# Patient Record
Sex: Female | Born: 1955 | Hispanic: Yes | Marital: Married | Smoking: Never smoker
Health system: Southern US, Community
[De-identification: ages and names within clinical notes are randomized; demographics above are authoritative.]

## PROBLEM LIST (undated history)

## (undated) DIAGNOSIS — E119 Type 2 diabetes mellitus without complications: Secondary | ICD-10-CM

## (undated) DIAGNOSIS — E7800 Pure hypercholesterolemia, unspecified: Secondary | ICD-10-CM

## (undated) HISTORY — DX: Pure hypercholesterolemia, unspecified: E78.00

## (undated) HISTORY — DX: Type 2 diabetes mellitus without complications: E11.9

---

## 2005-10-29 ENCOUNTER — Ambulatory Visit: Payer: Self-pay

## 2007-03-24 ENCOUNTER — Ambulatory Visit: Payer: Self-pay

## 2008-03-23 ENCOUNTER — Ambulatory Visit: Payer: Self-pay

## 2017-03-12 ENCOUNTER — Ambulatory Visit: Payer: Self-pay | Attending: Oncology | Admitting: *Deleted

## 2017-03-12 ENCOUNTER — Encounter: Payer: Self-pay | Admitting: *Deleted

## 2017-03-12 ENCOUNTER — Ambulatory Visit
Admission: RE | Admit: 2017-03-12 | Discharge: 2017-03-12 | Disposition: A | Discharge status: Home / Self Care | Payer: Self-pay | Source: Ambulatory Visit | Attending: Oncology | Admitting: Oncology

## 2017-03-12 VITALS — BP 122/76 | HR 75 | Temp 97.5°F | Resp 18 | Ht <= 58 in | Wt 144.0 lb

## 2017-03-12 DIAGNOSIS — Z0000 Encounter for general adult medical examination without abnormal findings: Secondary | ICD-10-CM

## 2017-03-12 NOTE — Patient Instructions (Signed)
Prueba del VPH (HPV Test) La prueba del virus del papiloma humano (VPH) se usa para detectar los tipos de infeccin por el VPH de alto riesgo. El VPH es un grupo de alrededor de 100 virus. Muchos de estos virus causan tumores dentro de los genitales, sobre ellos o a su alrededor. La mayora de los VPH provocan infecciones que suelen desaparecen sin tratamiento. Sin embargo, los tipos 6, 11, 16 y 18 del VPH se consideran de alto riesgo y pueden aumentar el riesgo de padecer cncer de cuello del tero o de ano si la infeccin no se trata. La prueba del VPH identifica las cadenas de ADN (genticas) de la infeccin por el VPH, por lo que tambin se denomina prueba de ADN para el VPH. Aunque el VPH se encuentra tanto en los hombres como en las mujeres, la prueba del VPH se usa solo para detectar un mayor riesgo de cncer en las mujeres:  Con una prueba de Papanicolaou anormal.  Despus del tratamiento de una prueba de Papanicolaou anormal.  Entre 30y 65aos.  Despus del tratamiento de una infeccin por el VPH de alto riesgo. La prueba del VPH se puede hacer al mismo tiempo que un examen plvico y una prueba de Papanicolaou en mujeres de ms de 30 aos. Tanto la prueba del VPH como la prueba de Papanicolaou requieren una muestra de clulas del cuello del tero. PREPARACIN PARA EL ESTUDIO  No se haga lavados vaginales ni se d un bao durante 24 a 48 horas antes de la prueba o como se lo haya indicado el mdico.  No tenga sexo durante 24 a 48 horas antes de la prueba o como se lo haya indicado el mdico.  Es posible que se le pida que reprograme la prueba si est menstruando.  Se le pedir que orine antes de la prueba. RESULTADOS Es su responsabilidad retirar el resultado del estudio. Consulte en el laboratorio o en el departamento en el que fue realizado el estudio cundo y cmo podr obtener los resultados. Hable con el mdico si tiene alguna pregunta sobre los resultados. El resultado ser  negativo o positivo. Significado de los resultados negativos de la prueba Un resultado negativo de la prueba del VPH significa que no se detect el VPH, y es muy probable que no tenga el virus. Significado de los resultados positivos de la prueba Un resultado positivo de la prueba del VPH indica que tiene el virus.  Si el resultado de la prueba muestra la presencia de alguna cadena del VPH de alto riesgo, puede tener mayor riesgo de padecer cncer de cuello del tero o de ano si la infeccin no se trata.  Si se encuentran cadenas del VPH de bajo riesgo, es poco probable que tenga un alto riesgo de padecer cncer. Hable con el mdico sobre los resultados. El mdico utilizar los resultados para realizar un diagnstico y determinar un plan de tratamiento adecuado para usted. Esta informacin no tiene como fin reemplazar el consejo del mdico. Asegrese de hacerle al mdico cualquier pregunta que tenga. Document Released: 12/19/2008 Document Revised: 09/23/2014 Document Reviewed: 01/18/2014 Elsevier Interactive Patient Education  2017 Elsevier Inc.  Gave patient hand-out, Women Staying Healthy, Active and Well from [facility], with education on breast health, pap smears, heart and colon health.  

## 2017-03-12 NOTE — Progress Notes (Signed)
Subjective:     Patient ID: Billey GoslingMaria R Patino Alvarez, female   DOB: 11-11-1955, 61 y.o.   MRN: [id number]  HPI   Review of Systems     Objective:   Physical Exam  Pulmonary/Chest: Right breast exhibits no inverted nipple, no mass, no nipple discharge, no skin change and no tenderness. Left breast exhibits no inverted nipple, no mass, no nipple discharge, no skin change and no tenderness. Breasts are symmetrical.  Abdominal: There is no splenomegaly or hepatomegaly.  Genitourinary: No labial fusion. There is no rash, tenderness, lesion or injury on the right labia. There is no rash, tenderness, lesion or injury on the left labia. Cervix exhibits no motion tenderness, no discharge and no friability. Right adnexum displays no mass, no tenderness and no fullness. Left adnexum displays no mass, no tenderness and no fullness. No erythema, tenderness or bleeding in the vagina. No foreign body in the vagina. No signs of injury around the vagina. No vaginal discharge found.       Assessment:     61 year old [place] [facility].  [place] pap [place].  Since I have no records for review will go ahead with her pap smear today.  [name], the interpreter present during the interview and exam.  Clinical breast exam unremarkable.  Taught self breast exam.  Specimen collected for pap smear.  Patient has been screened for eligibility.  She does not have any insurance, Medicare or Medicaid.  She also meets financial eligibility.  Hand-out given on the Affordable Care Act.    Plan:     Screening mammogram ordered.  Specimen for pap smear sent to the lab.  Will follow-up per [facility] protocol.

## 2017-03-14 LAB — PAP LB AND HPV HIGH-RISK
HPV, high-risk: POSITIVE — AB
PAP SMEAR COMMENT: 0

## 2017-03-31 ENCOUNTER — Inpatient Hospital Stay
Admission: RE | Admit: 2017-03-31 | Discharge: 2017-03-31 | Disposition: A | Discharge status: Home / Self Care | Payer: Self-pay | Source: Ambulatory Visit | Attending: *Deleted | Admitting: *Deleted

## 2017-03-31 ENCOUNTER — Other Ambulatory Visit: Payer: Self-pay | Admitting: *Deleted

## 2017-03-31 DIAGNOSIS — Z9289 Personal history of other medical treatment: Secondary | ICD-10-CM

## 2017-04-11 ENCOUNTER — Encounter: Payer: Self-pay | Admitting: *Deleted

## 2017-04-11 NOTE — Progress Notes (Signed)
Letter mailed to inform patient of her normal mammogram and pap smear showing positive for HPV.  She has been scheduled to return for annual mammo and pap on 04/08/18 @ 9:30.  [place]

## 2018-04-08 ENCOUNTER — Ambulatory Visit: Payer: Self-pay | Attending: Oncology | Admitting: *Deleted

## 2018-04-08 ENCOUNTER — Ambulatory Visit
Admission: RE | Admit: 2018-04-08 | Discharge: 2018-04-08 | Disposition: A | Discharge status: Home / Self Care | Payer: Self-pay | Source: Ambulatory Visit | Attending: Oncology | Admitting: Oncology

## 2018-04-08 ENCOUNTER — Ambulatory Visit: Payer: Self-pay

## 2018-04-08 ENCOUNTER — Other Ambulatory Visit: Payer: Self-pay

## 2018-04-08 VITALS — BP 121/74 | HR 80 | Temp 97.0°F | Ht <= 58 in | Wt 127.0 lb

## 2018-04-08 DIAGNOSIS — Z0000 Encounter for general adult medical examination without abnormal findings: Secondary | ICD-10-CM

## 2018-04-08 NOTE — Progress Notes (Addendum)
  Subjective:     Patient ID: Billey GoslingMaria R Patino Alvarez, female   DOB: 1955/11/24, 62 y.o.   MRN: [id number]  HPI   Review of Systems     Objective:   Physical Exam  Pulmonary/Chest: Right breast exhibits no inverted nipple, no mass, no nipple discharge, no skin change and no tenderness. Left breast exhibits no inverted nipple, no mass, no nipple discharge, no skin change and no tenderness.    Abdominal: There is no splenomegaly or hepatomegaly.  Genitourinary: Pelvic exam was performed with patient supine. No labial fusion. There is no rash, tenderness, lesion or injury on the right labia. There is no rash, tenderness, lesion or injury on the left labia. Cervix exhibits no motion tenderness, no discharge and no friability. Right adnexum displays no mass, no tenderness and no fullness. Left adnexum displays no mass, no tenderness and no fullness. No erythema, tenderness or bleeding in the vagina. No foreign body in the vagina. No signs of injury around the vagina. No vaginal discharge found.       Assessment:     62 year old [place] [facility]  [name], the interpreter present during the interview and exam.  Clinical breast exam with grainy fibroglandular tissue. Taught self breast awareness.  Last pap on 03/12/17 was negative / HPV positive.  Specimen collected for pap smear without difficulty. Patient has been screened for eligibility.  She does not have any insurance, Medicare or Medicaid.  She also meets financial eligibility.  Hand-out given on the Affordable Care Act.  Risk Assessment    Risk Scores      04/08/2018   Last edited by: Scarlett PrestoShaver, Anne F, RN   5-year risk: 1.2 %   Lifetime risk: 6.4 %            Plan:     Screening mammogram ordered.  Specimen for pap sent to the lab.

## 2018-04-08 NOTE — Patient Instructions (Signed)
Gave patient hand-out, Women Staying Healthy, Active and Well from [facility], with education on breast health, pap smears, heart and colon health.Prueba del VPH HPV Test La prueba del virus del papiloma humano (VPH) se usa para detectar los tipos de infeccin por el VPH de alto riesgo. El VPH es un grupo de alrededor de 100 virus. Muchos de estos virus causan tumores dentro de los genitales, sobre ellos o a su alrededor. La mayora de los VPH provocan infecciones que suelen desaparecen sin tratamiento. Sin embargo, los tipos 6, 11, 16 y 18 del VPH se consideran de alto riesgo y pueden aumentar el riesgo de padecer cncer de cuello del tero o de ano si la infeccin no se trata. La prueba del VPH identifica las cadenas de ADN (genticas) de la infeccin por el VPH, por lo que tambin se denomina prueba de ADN para el VPH. Aunque el VPH se encuentra tanto en los hombres como en las mujeres, la prueba del VPH se usa solo para detectar un mayor riesgo de cncer en las mujeres:  Con una prueba de Papanicolaou anormal.  Despus del tratamiento de una prueba de Papanicolaou anormal.  Entre 30y 65aos.  Despus del tratamiento de una infeccin por el VPH de alto riesgo.  La prueba del VPH se puede hacer al mismo tiempo que un examen plvico y una prueba de Papanicolaou en mujeres de ms de 30 aos. Tanto la prueba del VPH como la prueba de Papanicolaou requieren una muestra de clulas del cuello del tero. Cmo debo prepararme para esta prueba?  No se haga lavados vaginales ni se d un bao durante 24 a 48 horas antes de la prueba o como se lo haya indicado el mdico.  No tenga sexo durante 24 a 48 horas antes de la prueba o como se lo haya indicado el mdico.  Es posible que se le pida que reprograme la prueba si est menstruando.  Se le pedir que orine antes de la prueba. Qu significan los resultados? Es su responsabilidad retirar el resultado del estudio. Consulte en el laboratorio o en el  departamento en el que fue realizado el estudio cundo y cmo podr obtener los resultados. Hable con el mdico si tiene alguna pregunta sobre los resultados. El resultado ser negativo o positivo. Significado de los resultados negativos del anlisis Un resultado negativo de la prueba del VPH significa que no se detect el VPH, y es muy probable que no tenga el virus. Significado de los resultados positivos del anlisis Un resultado positivo de la prueba del VPH indica que tiene el virus.  Si el resultado de la prueba muestra la presencia de alguna cadena del VPH de alto riesgo, puede tener mayor riesgo de padecer cncer de cuello del tero o de ano si la infeccin no se trata.  Si se encuentran cadenas del VPH de bajo riesgo, es poco probable que tenga un alto riesgo de padecer cncer.  Hable con el mdico sobre los resultados. El mdico utilizar los resultados para realizar un diagnstico y determinar un plan de tratamiento adecuado para usted. Hable con el mdico sobre los resultados, las opciones de tratamiento y, si es necesario, la necesidad de realizar ms estudios. Hable con el mdico si tiene alguna pregunta sobre los resultados. Esta informacin no tiene como fin reemplazar el consejo del mdico. Asegrese de hacerle al mdico cualquier pregunta que tenga. Document Released: 12/19/2008 Document Revised: 11/21/2016 Document Reviewed: 01/18/2014 Elsevier Interactive Patient Education  2018 Elsevier Inc.  

## 2018-04-10 LAB — PAP LB AND HPV HIGH-RISK
HPV, HIGH-RISK: POSITIVE — AB
PAP Smear Comment: 0

## 2018-04-16 ENCOUNTER — Encounter: Payer: Self-pay | Admitting: *Deleted

## 2018-04-16 NOTE — Progress Notes (Signed)
Called patient with her mammogram and pap smear results via Anne NgMartiza, the interpreter.  Patient has normal mammogram.  Patient results are negative HPV positive.  Since this is her second HPV positive pap smear she will need referral for colposcopy and possible biopsy.  [place] in [place] and will not be available until after September 20th.  I have scheduled her to see Dr. Valentino Saxonherry on 06/09/18 @ 10:00.

## 2018-04-20 ENCOUNTER — Encounter: Payer: Self-pay | Admitting: *Deleted

## 2018-06-09 ENCOUNTER — Encounter: Payer: Self-pay | Admitting: Obstetrics and Gynecology

## 2018-06-12 ENCOUNTER — Encounter: Payer: Self-pay | Admitting: Obstetrics and Gynecology

## 2018-06-12 ENCOUNTER — Other Ambulatory Visit: Payer: Self-pay | Admitting: Obstetrics and Gynecology

## 2018-06-12 ENCOUNTER — Ambulatory Visit (INDEPENDENT_AMBULATORY_CARE_PROVIDER_SITE_OTHER): Payer: Self-pay | Admitting: Obstetrics and Gynecology

## 2018-06-12 VITALS — BP 127/70 | HR 69 | Ht <= 58 in | Wt 121.3 lb

## 2018-06-12 DIAGNOSIS — B977 Papillomavirus as the cause of diseases classified elsewhere: Secondary | ICD-10-CM

## 2018-06-12 NOTE — Progress Notes (Signed)
    GYNECOLOGY CLINIC COLPOSCOPY PROCEDURE NOTE  62 y.o. [id number] Hispanic female here for colposcopy for NILM, HR HPV+ (no typing performed) pap smear on 04/08/2018. 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  Interpreter present for today's visit.   Patient given informed consent, signed copy in the chart, time out was performed.  Placed in lithotomy position. Cervix viewed with speculum and colposcope after application of acetic acid.   Colposcopy adequate? Yes  HPV changes noted at 9 o'clock; corresponding biopsies obtained.  ECC specimen obtained. All specimens were labeled and sent to pathology.  Also HPV typing performed today.    Patient was given post procedure instructions.  Will follow up pathology and manage accordingly; patient will be contacted with results and recommendations.  Routine preventative health maintenance measures emphasized.    Hildred Laser, MD [facility] 06/12/2018 11:27 AM

## 2018-06-12 NOTE — Patient Instructions (Signed)
Prueba del VPH HPV Test La prueba del virus del papiloma humano (VPH) se [place] para Engineer, manufacturing los tipos de infeccin por el VPH de Conservator, museum/gallery. El VPH es un grupo de alrededor de 100 virus. Muchos de estos virus causan tumores dentro de los genitales, sobre ellos o a su alrededor. La mayora de los VPH provocan infecciones que suelen desaparecen sin tratamiento. Sin embargo, los tipos 6, 11, 16 y 31 del VPH se consideran de Conservator, museum/gallery y pueden aumentar el riesgo de Geneticist, molecular de cuello del tero o de ano si la infeccin no se trata. La prueba del VPH identifica las cadenas de ADN (genticas) de la infeccin por el VPH, por lo que tambin se denomina prueba de [place] para [employer]. Aunque el VPH se Building surveyor en los hombres como en las mujeres, la prueba del VPH se [place] solo para Engineer, manufacturing un mayor riesgo de cncer en las mujeres:  Con una prueba de Papanicolaou anormal.  Despus del tratamiento de una prueba de Papanicolaou anormal.  Entre 30y 65aos.  Despus del tratamiento de una infeccin por el VPH de [employer].  La prueba del VPH se puede hacer al mismo tiempo que un examen plvico y Neomia Dear prueba de Papanicolaou en mujeres de ms de 30 aos. Tanto la prueba del VPH como la prueba de Papanicolaou requieren Lauris Poag de clulas del cuello del tero. Cmo debo prepararme para esta prueba?  No se haga lavados vaginales ni se d un bao durante 24 a 48 horas antes de la prueba o como se lo haya indicado el mdico.  No tenga sexo durante 24 a 48 horas antes de la prueba o como se lo haya indicado el mdico.  Es posible que se le pida que reprograme la prueba si est menstruando.  Se le pedir que orine antes de la prueba. Qu significan los resultados? Es su responsabilidad retirar el resultado del [place]. Consulte en el laboratorio o en el departamento en el que fue realizado el estudio cundo y cmo podr [employer]. Hable con el mdico si tiene [employer]. El resultado ser negativo o positivo. Significado de los [employer] Corporation del anlisis Un resultado negativo de la prueba del VPH significa que no se detect el VPH, y es muy probable que no tenga el virus. Significado de [employer] positivos del anlisis Un resultado positivo de la prueba del VPH indica que tiene el virus.  Si el resultado de la prueba [place] la presencia de [place] cadena del VPH de alto riesgo, puede tener mayor riesgo de Geneticist, molecular de cuello del tero o de ano si la infeccin no se trata.  Si se encuentran cadenas del VPH de bajo riesgo, es poco probable que tenga un alto riesgo de Geneticist, molecular.  Hable con el mdico [employer]. El mdico [employer] para Education officer, environmental un diagnstico y Chief Strategy Officer un plan de tratamiento adecuado para usted. Hable con el mdico [employer], las opciones de tratamiento y, si es necesario, la necesidad de Education officer, environmental ms [place]. Hable con el mdico si tiene [employer]. Esta informacin no tiene Theme park manager el consejo del mdico. Asegrese de hacerle al mdico cualquier pregunta que tenga. Document Released: 12/19/2008 Document Revised: 11/21/2016 Document Reviewed: 01/18/2014 Elsevier Interactive Patient Education  2018 [employer].   Virus del papiloma humano (Human Papillomavirus) El virus del papiloma humano (VPH) es la enfermedad de transmisin sexual (ETS) ms frecuente. Se  transmite fcilmente de [place] persona a otra (es contagiosa). El VPH puede provocar cncer de cuello del tero, cncer anal y verrugas genitales. Las verrugas genitales pueden observarse y sentirse. Adems, es posible desarrollar lesiones similares a las Microbiologist. Es posible que el VPH no presente ningn sntoma. Puede padecer el VPH por un perodo prolongado y no saberlo. Puede transmitirlo a otros sin saberlo. CUIDADOS EN EL HOGAR  Tome los Estée Lauder indic  el mdico.  Use cremas de venta libre para la picazn como se lo haya indicado el mdico.  Cumpla con todas las visitas de control. Asegrese de realizarse las pruebas de Papanicolaou como se lo haya indicado el mdico.  No toque ni rasque las verrugas.  No trate las verrugas genitales con los medicamentos utilizados para el tratamiento de las verrugas de las manos.  No puede Nutritional therapist.  No utilice tampones ni duchas vaginales durante el tratamiento del VPH.  Informe a su compaero sexual acerca de su infeccin porque tambin podra Network engineer.  Si queda embarazada, informe a su mdico que [employer]. El mdico controlar rigurosamente Firefighter. Esto es importante para la seguridad del beb.  Despus del tratamiento, use preservativos durante las relaciones sexuales para prevenir futuras infecciones.  Tenga solo un compaero sexual.  No tenga un compaero sexual que tenga otros International aid/development worker.  SOLICITE AYUDA SI:  La piel tratada se enrojece, se hincha o duele.  Tiene fiebre.  Se siente enferma.  Siente bultos o reas con protuberancias tipo granos en la zona genital o alrededor de esta.  Presenta sangrado vaginal o en la zona que recibi tratamiento.  Siente dolor durante las [employer].  ASEGRESE DE QUE:  Comprende estas instrucciones.  Controlar su afeccin.  Solicitar ayuda de inmediato si no mejora o si empeora.  Esta informacin no tiene Theme park manager el consejo del mdico. Asegrese de hacerle al mdico cualquier pregunta que tenga. Document Released: 10/05/2010 Document Revised: 09/23/2014 Document Reviewed: 12/08/2013 Elsevier Interactive Patient Education  2017 [employer].     [place], Cuidado posterior (Colposcopy, Care After) La colposcopa es un procedimiento en el que se utiliza una herramienta especial para magnificar la superficie del cuello del tero. Tambin es  posible que se tome una muestra de tejido (biopsia). Esta muestra se observar para identificar la presencia de cncer cervical u otros problemas. Despus del procedimiento:  Podr sentir algunos clicos.  Recustese algunos minutos si se siente mareada.  Podr tener un sangrado que debera detenerse luego de [place]. CUIDADOS EN EL HOGAR  No tenga relaciones sexuales ni use tampones durante 2 o 3 das o segn le hayan indicado.  Slo tome medicamentos como lo indique su mdico.  Contine tomando las pastillas anticonceptivas de la forma habitual. Averige los Victor de su anlisis Pregunte cundo estarn [employer] del examen. Asegrese de Starbucks Corporation. SOLICITE AYUDA DE INMEDIATO SI:  Tiene un sangrado abundante o elimina cogulos.  Su temperatura es de 102 F (38.9 C) o mayor.  Brett Fairy secrecin vaginal anormal.  Tiene clicos que no se [employer].  Siente mareos, vrtigo o pierde el conocimiento (se desmaya). ASEGRESE DE QUE:   Comprende estas instrucciones.  Controlar su enfermedad.  Solicitar ayuda de inmediato si no mejora o si empeora. Esta informacin no tiene Theme park manager el consejo del mdico. Asegrese de hacerle al mdico cualquier pregunta que tenga. Document Released: 10/05/2010 Document Revised: 11/25/2011 Elsevier  Education  2017 Elsevier Inc.  

## 2018-06-12 NOTE — Progress Notes (Signed)
   PT is present today for a colpo. Pt stated that she is doing well no complaints.   

## 2018-06-18 LAB — PATHOLOGY

## 2018-06-19 LAB — HPV GENOTYPES 16/18,45
HPV GENOTYPE 16: NEGATIVE
HPV GENOTYPE 18,45: NEGATIVE

## 2018-06-23 ENCOUNTER — Encounter: Payer: Self-pay | Admitting: *Deleted

## 2018-06-23 NOTE — Progress Notes (Signed)
Talked to patient today via Orson Slick, the interpreter.  Informed of her cervical biopsy results and recommendation from Dr. Valentino Saxon of possible LEEP procedure.  The patient is not a [place] Citizen, therefore, I cannot apply for [facility] Medicaid to cover the LEEP.  The patient has agreed to complete patient financial paperwork for [facility].  She requested I mail the application to her.  Application sent to patient and she is to return to me at the cancer center.  Dr. Oretha Milch office to schedule follow-up for the patient.

## 2018-07-16 ENCOUNTER — Encounter: Payer: Self-pay | Admitting: *Deleted

## 2018-07-16 NOTE — Progress Notes (Signed)
Patient turned in her patient assistant financial application.  It has been turned in to [facility] for processing.

## 2018-08-18 ENCOUNTER — Encounter: Payer: Self-pay | Admitting: *Deleted

## 2018-08-18 NOTE — Progress Notes (Addendum)
Spoke to Reliant EnergyBrandi Harvell today in regards to patient's financial assistance approval.  She has emailed [facility] to check the status.  Patient is pending a LEEP procedure.  Patient has been approved for Financial Assistance [facility].  Approval effective 12/1/9 - 02/15/2019 - Message sent to Dr. Valentino Saxonherry.

## 2018-09-04 ENCOUNTER — Encounter: Payer: Self-pay | Admitting: Obstetrics and Gynecology

## 2018-09-21 ENCOUNTER — Encounter: Payer: Self-pay | Admitting: *Deleted

## 2018-09-21 NOTE — Progress Notes (Signed)
Patient is scheduled for her LEEP procedure with Dr. Valentino Saxon on 09/29/18.  Will follow with next pap per Dr. Valentino Saxon and [facility] guidelines.

## 2018-09-29 ENCOUNTER — Ambulatory Visit (INDEPENDENT_AMBULATORY_CARE_PROVIDER_SITE_OTHER): Payer: Self-pay | Admitting: Obstetrics and Gynecology

## 2018-09-29 ENCOUNTER — Other Ambulatory Visit (HOSPITAL_COMMUNITY)
Admission: RE | Admit: 2018-09-29 | Discharge: 2018-09-29 | Disposition: A | Discharge status: Home / Self Care | Payer: Self-pay | Source: Ambulatory Visit | Attending: Obstetrics and Gynecology | Admitting: Obstetrics and Gynecology

## 2018-09-29 ENCOUNTER — Encounter: Payer: Self-pay | Admitting: Obstetrics and Gynecology

## 2018-09-29 VITALS — BP 124/76 | HR 71 | Ht <= 58 in | Wt 126.6 lb

## 2018-09-29 DIAGNOSIS — N871 Moderate cervical dysplasia: Secondary | ICD-10-CM | POA: Insufficient documentation

## 2018-09-29 MED ORDER — IBUPROFEN 600 MG PO TABS: 600.0000 mg | ORAL_TABLET | Freq: Four times a day (QID) | ORAL | 1 refills | Status: AC | PRN

## 2018-09-29 NOTE — Addendum Note (Signed)
Addended by: Silvano Bilis on: 09/29/2018 11:34 AM   Modules accepted: Orders

## 2018-09-29 NOTE — Progress Notes (Signed)
    GYNECOLOGY OFFICE PROCEDURE NOTE  Yvette Taylor is a 63 y.o. [phone] here for LEEP. No GYN concerns. Pap smear and colposcopy history reviewed.    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  Role of HPV,cervical dysplasia and need for close followup was empasized. Informed written consent was obtained. All questions were answered. Time out performed. Urine pregnancy test was negative.  Procedure: The patient was placed in lithotomy position and the bivalved coated speculum was placed in the patient's vagina. A grounding pad placed on the patient. Lugol's solution was applied to the cervix and areas of decreased uptake were noted around the transformation zone.   Local anesthesia was administered via an intracervical block using 10 ml of 1% Lidocaine with epinephrine. The suction was turned on and the Medium 1X Fisher [facility] Biopsy Excisor on 37 Watts of blended current was used to excise the area of decreased uptake and excise the entire transformation zone.  A A small LEEP hat was then used to excise an area of the endocervix. Excellent hemostasis was achieved using roller ball coagulation set at 60 Watts coagulation current. Monsel's solution was then applied and the speculum was removed from the vagina. Specimens were sent to pathology.  The patient tolerated the procedure well. Post-operative instructions given to patient, including instruction to seek medical attention for persistent bright red bleeding, fever, abdominal/pelvic pain, dysuria, nausea or vomiting. She was also told about the possibility of having copious yellow to black  tinged discharge for weeks. She was counseled to avoid anything in the vagina (sex/douching/tampons) for 3 weeks. She has a 4 week post-operative check to assess wound healing, review results and discuss further management.     Hildred Laser, MD [facility]

## 2018-09-29 NOTE — Progress Notes (Signed)
Pt is present today for a LEEP. Pt stated that she is doing well no complaints.

## 2018-09-29 NOTE — Patient Instructions (Signed)
Procedimiento de escisin electroquirrgica con asa  (Loop Electrosurgical Excision Procedure)  El procedimiento de escisin electroquirrgica con asa es el corte y la extirpacin de una seccin del cuello del tero. El cuello del tero es la parte inferior del tero que desemboca en la vagina. Luego, el tejido que se extrae del cuello del tero se examina para detectar la presencia de clulas precancerosas o cancerosas. Se puede realizar un procedimiento de escisin electroquirrgica con asa en las siguientes circunstancias:   Tiene una hemorragia anormal del cuello del tero.   El resultado de la prueba de Papanicolaou es anormal.   El mdico le detecta una anomala en el cuello del tero durante un examen plvico.  Normalmente, el procedimiento de escisin electroquirrgica con asa tarda unos pocos minutos y suele realizarse en el consultorio del mdico. Se trata de un procedimiento seguro para las mujeres que intentan quedar embarazadas. Sin embargo, por lo general no se lo realiza durante la menstruacin ni el embarazo.  INFORME A SU MDICO:   Cualquier alergia que tenga.   Todos los medicamentos que utiliza, incluidos vitaminas, hierbas, gotas oftlmicas, cremas y medicamentos de venta libre.   Problemas previos que usted o los miembros de su familia hayan tenido con el uso de anestsicos.   Enfermedades de la sangre que tenga.   Si tiene cirugas previas.   Cualquier enfermedad que tenga, incluidas las infecciones vaginales actuales o pasadas, como herpes o enfermedades de transmisin sexual (ETS).   Si est embarazada o podra estarlo.  RIESGOS Y COMPLICACIONES  En general, se trata de un procedimiento seguro. Sin embargo, pueden presentarse problemas, por ejemplo:   Infeccin.   Hemorragia.   Reacciones alrgicas a los medicamentos.   Cambios o formacin de cicatrices en el cuello del tero.   Aumento del riesgo de parto prematuro en futuros embarazos.  ANTES DEL PROCEDIMIENTO   Consulte al  mdico si debe hacer o no lo siguiente:  ? Cambiar o suspender los medicamentos que toma habitualmente. Esto es muy importante si toma medicamentos para la diabetes o anticoagulantes.  ? Tomar medicamentos como aspirina e ibuprofeno. Estos medicamentos pueden tener un efecto anticoagulante en la sangre. No tome estos medicamentos antes del procedimiento si el mdico le indica que no lo haga.   El mdico puede recomendarle que tome analgsicos antes del procedimiento.   Haga planes para que una persona lo lleve de vuelta a su casa despus del procedimiento.  PROCEDIMIENTO   Le colocarn un instrumento llamado espculo en la vagina. Esto le permitir al mdico observar el cuello del tero.   Le administrarn un medicamento para adormecer la zona (anestesia local). El medicamento se inyectar en el cuello del tero y en la zona circundante.   Le pondrn una solucin en el cuello del tero. Esta solucin ayudar al mdico a encontrar las clulas anormales que se deben extirpar.   Le colocarn un asa de alambre fino a travs de la vagina. El alambre se usar para quemar (cauterizar) el tejido del cuello del tero con una corriente elctrica.   Se extirpar el tejido del cuello del tero que es anormal.   Se cauterizarn los vasos sanguneos abiertos para evitar el sangrado.   Se aplicar una pasta en la zona cauterizada del cuello del tero para ayudar a evitar el sangrado.   La muestra de tejido del cuello del tero se examinar con un microscopio.  Este procedimiento puede variar segn el mdico y el hospital.  DESPUS DEL PROCEDIMIENTO     Puede tener clicos abdominales leves.   Puede tener una pequea hemorragia vaginal (manchado).   Puede tener una secrecin de color oscuro proveniente de la vagina. Esto se debe a la pasta que se aplic en el cuello del tero para evitar el sangrado.   El mdico puede recomendarle que haga reposo plvico. Generalmente, el reposo plvico significa evitar las relaciones  sexuales y no introducirse nada en la vagina, como tampones o cremas, ni hacerse duchas vaginales.   Es su responsabilidad retirar el resultado del estudio. Pregntele al mdico o consulte en el departamento donde se realice el estudio cundo estarn listos los resultados.  Esta informacin no tiene como fin reemplazar el consejo del mdico. Asegrese de hacerle al mdico cualquier pregunta que tenga.  Document Released: 05/15/2011 Document Revised: 11/25/2011 Document Reviewed: 07/17/2015  Elsevier Interactive Patient Education  2019 Elsevier Inc.

## 2018-10-22 ENCOUNTER — Encounter: Payer: Self-pay | Admitting: Obstetrics and Gynecology

## 2018-10-22 ENCOUNTER — Ambulatory Visit: Payer: Self-pay | Admitting: Obstetrics and Gynecology

## 2018-10-22 VITALS — BP 129/73 | HR 77 | Ht <= 58 in | Wt 129.5 lb

## 2018-10-22 DIAGNOSIS — D069 Carcinoma in situ of cervix, unspecified: Secondary | ICD-10-CM

## 2018-10-22 NOTE — Progress Notes (Signed)
Pt is present today for test results from her LEEP. Pt stated that she is doing well, but had mild bleeding for 15 days after the LEEP. Pt stated no bleeding since then.

## 2018-10-22 NOTE — Patient Instructions (Signed)
Displasia cervical Cervical Dysplasia  La displasia cervical es una afeccin que ocurre cuando una mujer presenta cambios anormales en las clulas del cuello uterino. El cuello uterino es la abertura del tero. Se encuentra entre la vagina y Careers information officerel tero. La displasia cervical puede ser un signo temprano de cncer de cuello uterino. Si no se trata, esta afeccin puede volverse ms grave y progresar a cncer de cuello uterino. La deteccin temprana, el tratamiento y la atencin de seguimiento son Engineer, productionmuy importantes. Cules son las causas? Una infeccin causada por el virus del papiloma humano (VPH) puede provocar la displasia cervical. La infeccin causada por el VPH es la infeccin de transmisin sexual (ITS) ms frecuente. El VPH se propaga de Neomia Dearuna persona a otra a travs del contacto sexual. Esto abarca la actividad sexual vaginal, oral y anal. Qu incrementa el riesgo? Los siguientes factores pueden hacer que usted sea ms propenso a tener esta afeccin:  Despus de sufrir una infeccin de transmisin sexual (ITS), como herpes, clamidia o VPH.  Ser sexualmente activa antes de los 18 aos.  Haber tenido ms de una pareja sexual.  Winferd Humphreyener una pareja sexual que tiene mltiples parejas sexuales.  No usar proteccin, como un preservativo, durante las [address] Nerelaciones sexuales, especialmente con parejas sexuales nuevas.  Tener antecedentes de cncer en la vagina o en la vulva.  Tener el sistema de defensa del cuerpo (sistema inmunitario) debilitado.  Ser hija de una mujer que tom dietilestilbestrol (DES) durante el embarazo.  Tener antecedentes familiares de cncer de cuello del tero.  Fumar.  Usar anticonceptivos orales, tambin llamados pldoras anticonceptivas.  Haber tenido tres o ms embarazos a trmino. Cules son los signos o los sntomas? Esta afeccin generalmente no presenta sntomas. Si tiene sntomas, estos pueden incluir los siguientes:  Secrecin vaginal anormal.  Sangrado entre  los perodos menstruales o despus de Warehouse managertener relaciones sexuales.  Sangrado durante la menopausia.  Dolor durante las relaciones sexuales (dispareunia). Cmo se diagnostica? Se puede realizar una prueba de Papanicolaou. Durante esta prueba, se extraen clulas del cuello uterino y, luego, se examinan con un microscopio. Tambin puede realizarse una prueba en la que se extirpa tejido del cuello uterino (biopsia), si la prueba de Papanicolaou es anormal o si el aspecto del cuello del tero es anormal. Cmo se trata? El tratamiento vara segn la gravedad de la enfermedad. El tratamiento puede incluir lo siguiente:  Crioterapia. Durante la crioterapia, las clulas anormales se congelan con un instrumento con punta de acero.  Procedimiento de escisin electroquirrgica con asa (loop electrosurgical excision procedure, LEEP). Este es un procedimiento para extirpar tejido anormal del cuello uterino.  Ciruga para extirpar tejido anormal. Esta generalmente se realiza en los casos ms avanzados. Las opciones quirrgicas son: ? Biopsia en cono. Este es un procedimiento en el que se extirpan el canal cervical y Neomia Dearuna parte del centro del cuello uterino. ? Histerectoma. En esta ciruga se extirpan el tero y el cuello uterino. Siga estas indicaciones en su casa:  Tome los medicamentos de venta libre y los recetados solamente como se lo haya indicado el mdico.  No use tampones, duchas vaginales, ni tenga relaciones sexuales hasta que el mdico la autorice.  Concurra a las visitas de control como se lo haya indicado el mdico. Esto es importante. Las mujeres que han recibido tratamiento para la displasia cervical deberan someterse a pruebas de Papanicolaou y exmenes plvicos segn se los indique el mdico. Cmo se evita?  Practique sexo seguro para ayudar a prevenir las infecciones de  transmisin sexual (ITS) que pueden causar esta afeccin.  Hgase pruebas de Papanicolaou de forma regular. Hable  con el mdico sobre la frecuencia con la que debe hacerse estas pruebas. Las pruebas de Papanicolaou ayudan a Sports coach en las clulas que pueden Therapist, music. Comunquese con un mdico si:  Presenta verrugas genitales. Solicite ayuda de inmediato si:  Tiene fiebre.  Tiene secrecin vaginal anormal.  El perodo menstrual es ms abundante que lo normal.  Presenta un sangrado rojo brillante. Este puede incluir cogulos de [place].  Tiene dolor o clicos en el vientre que empeoran y no se alivian con los medicamentos.  Se siente mareada e inusualmente dbil.  Sufre episodios de [employer].  Tiene dolor en el abdomen. Resumen  La displasia cervical es una afeccin que ocurre cuando una mujer presenta cambios anormales en las clulas del cuello uterino.  Si no se trata, esta afeccin puede volverse ms grave y progresar a cncer de cuello uterino.  La deteccin temprana, el tratamiento y la atencin de seguimiento son muy importantes para Research officer, trade union.  Hgase exmenes plvicos y pruebas de Papanicolaou de forma regular. Hable con el mdico sobre la frecuencia con la que debe hacerse estas pruebas. Las pruebas de Papanicolaou ayudan a Sports coach en las clulas que pueden Therapist, music. Esta informacin no tiene Theme park manager el consejo del mdico. Asegrese de hacerle al mdico cualquier pregunta que tenga. Document Released: 06/23/2013 Document Revised: 04/22/2017 Document Reviewed: 04/28/2013 Elsevier Interactive Patient Education  2019 [employer].

## 2018-10-22 NOTE — Progress Notes (Signed)
    OBSTETRICS/GYNECOLOGY POST-OPERATIVE CLINIC VISIT  Subjective:     Yvette Taylor is a 63 y.o. [phone] female who presents to the clinic 3 weeks status post LEEP procedure for cervical dysplasia. Eating a regular diet without difficulty.  The patient is not having any pain.  She does note that she had bleeding for 15 days after her procedure, but was light.    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  Review of Systems Pertinent items noted in HPI and remainder of comprehensive ROS otherwise negative.    Objective:    BP 129/73   Pulse 77   Ht 4\' 9"  (1.448 m)   Wt 129 lb 8 oz (58.7 kg)   LMP 02/14/2001 (Approximate)   BMI 28.02 kg/m  General:  alert and no distress  Abdomen: soft, bowel sounds active, non-tender  Pelvis:   external genitalia normal, rectovaginal septum normal.  Vagina without discharge.  Cervix normal appearing, healing well, no lesions.     Pathology:   Diagnosis 1. Endocervix, curettage - UNREMARKABLE ENDOCERVICAL GLANDULAR EPITHELIUM. 2. 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 - MARGINS OF RESECTION ARE NOT INVOLVED. 3. Cervix, LEEP, Ectocervix, 12 o'clock - LOW GRADE SQUAMOUS INTRAEPITHELIAL LESION, CIN-I (MILD DYSPLASIA). - MARGINS OF RESECTION ARE NOT INVOLVED.  Assessment:    Doing well postoperatively.  CIN III  Plan:   1. Continue any current medications. 2. Wound care discussed. 3. Operative findings again reviewed. Pathology report discussed. 4. Activity restrictions: none 5. Anticipated return to work: now. 6.  Follow up: 6 months for repeat pap and ECC. Will arrange with BCCCP program.     Hildred Laser, MD [facility]

## 2019-04-22 ENCOUNTER — Encounter: Payer: Self-pay | Admitting: Obstetrics and Gynecology

## 2019-05-31 ENCOUNTER — Other Ambulatory Visit: Payer: Self-pay

## 2019-06-01 ENCOUNTER — Ambulatory Visit
Admission: RE | Admit: 2019-06-01 | Discharge: 2019-06-01 | Disposition: A | Discharge status: Home / Self Care | Payer: Self-pay | Source: Ambulatory Visit | Attending: Oncology | Admitting: Oncology

## 2019-06-01 ENCOUNTER — Encounter: Payer: Self-pay | Admitting: *Deleted

## 2019-06-01 ENCOUNTER — Other Ambulatory Visit: Payer: Self-pay

## 2019-06-01 ENCOUNTER — Ambulatory Visit: Payer: Self-pay | Attending: Oncology | Admitting: *Deleted

## 2019-06-01 ENCOUNTER — Encounter (INDEPENDENT_AMBULATORY_CARE_PROVIDER_SITE_OTHER): Payer: Self-pay

## 2019-06-01 VITALS — BP 119/70 | HR 90 | Temp 98.0°F | Ht 59.0 in | Wt 140.0 lb

## 2019-06-01 DIAGNOSIS — Z0000 Encounter for general adult medical examination without abnormal findings: Secondary | ICD-10-CM | POA: Insufficient documentation

## 2019-06-01 NOTE — Patient Instructions (Signed)
Prueba del VPH HPV Test Por qu me debo realizar esta prueba? EL VPH (virus del papiloma humano) se refiere a un grupo de aproximadamente 100 virus. Muchos de estos virus causan tumores dentro de los genitales, sobre ellos o a su alrededor. La mayora de los VPH provocan infecciones que suelen desaparecer sin tratamiento.  La prueba del VPH se realiza para Engineer, manufacturing tipos de Conservator, museum/gallery (cepas) del VPH. Las cepas 16 y 18 se consideran las de mayor riesgo para Management consultant. Si tiene las cepas 16 o 18 del VPH y no se lo trata, Diplomatic Services operational officer de padecer cncer de cuello uterino, vagina, vulva o ano. El VPH afecta tanto a los hombres como a las mujeres. Sin embargo, la prueba del VPH se [place] para Engineer, manufacturing si existe un mayor riesgo de cncer en las mujeres que:  Tienen entre 30 y 79aos.  Tienen una prueba de Papanicolaou anormal.  Han recibido un tratamiento por una prueba de Papanicolaou anormal en el pasado.  Han recibido un tratamiento para una infeccin por el VPH de alto riesgo en el pasado. Si usted es [employer] mayor de 30 aos, pueden hacerle la prueba del VPH al mismo tiempo que un examen plvico y Neomia Dear prueba de Papanicolaou. Qu se analiza? Esta prueba identifica las cadenas de ADN (genticas) de la infeccin por VPH. Esta prueba tambin se denomina prueba de ADN del VPH. Qu tipo de [place] se toma?  Esta prueba requiere Lauris Poag de clulas del cuello uterino. Lo har utilizando un pequeo hisopo de algodn, una esptula de plstico o un cepillo. Esta muestra se recolecta durante un examen plvico, mientras usted est recostada boca arriba sobre la mesa de examen con los pies en los descansos para pies (estribos). Cmo debo prepararme para esta prueba?  A partir de 24 a 48 horas antes de su prueba, o segn se lo indique su mdico, no haga lo siguiente: ? Tomar baos. ? Tener [employer] [place] [employer]. ? Realizar duchas vaginales.  Programe la prueba durante un da en el que  no est menstruando. Es posible que tenga que volver a Charity fundraiser la prueba si est Magazine features editor en que debe Futures trader.  Se le pedir que orine justo antes de la prueba. Cmo se informan los resultados? Los resultados de la prueba se informarn como positivos o negativos para Copywriter, advertising. Si el resultado de la prueba es positivo, este indicar la cepa del VPH que tiene. Qu significan los resultados? El resultado negativo de la prueba significa que no se detect VPH. Esto significa que es muy probable que no Chief Executive Officer. Un resultado positivo de la prueba del VPH significa que tiene el virus.  Si sus resultados revelan la presencia de cualquiera de las cepas de alto riesgo, usted puede tener un mayor riesgo de Geophysical data processor de ano o cuello uterino si la infeccin no se trata.  Si se encuentran cepas del VPH de bajo riesgo, es poco probable que tenga un alto riesgo de Geneticist, molecular. Hable con su mdico sobre lo que significan sus [place]. Preguntas para hacerle al mdico Consulte a su mdico o pregunte en el departamento donde se realiza la prueba acerca de lo siguiente:  Cundo estarn disponibles mis resultados?  Cmo obtendr mis resultados?  Cules son mis opciones de tratamiento?  Qu otras pruebas necesito?  Cules son los prximos pasos que debo seguir? Resumen  La prueba del virus del papiloma humano (VPH) se [place] para Engineer, manufacturing los tipos de infeccin  por el VPH de alto riesgo. Esta prueba se realiza solo en las mujeres.  Los tipos 16 y 18 del VPH se consideran de alto riesgo. Si no se tratan, estos tipos de infecciones aumentan el riesgo de cncer de cuello uterino o de ano.  Un resultado negativo de la prueba del VPH significa que no se detect el VPH, y es muy probable que no tenga el virus.  Un resultado positivo de la prueba del VPH significa que tiene una infeccin por VPH. Esta informacin no tiene como fin reemplazar el consejo del mdico. Asegrese  de hacerle al mdico cualquier pregunta que tenga. Document Released: 12/19/2008 Document Revised: 12/13/2017 Document Reviewed: 09/26/2017 Elsevier Patient Education  2020 Elsevier Inc.  

## 2019-06-01 NOTE — Progress Notes (Signed)
  Subjective:     Patient ID: Yvette Taylor, female   DOB: April 18, 1956, 63 y.o.   MRN: [id number]  HPI   Review of Systems     Objective:   Physical Exam Chest:     Breasts:        Right: No swelling, bleeding, inverted nipple, mass, nipple discharge, skin change or tenderness.        Left: No swelling, bleeding, inverted nipple, mass, nipple discharge, skin change or tenderness.  Abdominal:     Palpations: There is no hepatomegaly or splenomegaly.  Genitourinary:    Exam position: Lithotomy position.     Pubic Area: No rash or pubic lice.      Labia:        Right: No rash, tenderness, lesion or injury.        Left: No rash, tenderness, lesion or injury.      Urethra: No prolapse, urethral pain, urethral swelling or urethral lesion.     Vagina: No signs of injury and foreign body. No vaginal discharge, erythema, tenderness, bleeding or prolapsed vaginal walls.     Cervix: No cervical motion tenderness, discharge, friability, lesion, erythema, cervical bleeding or eversion.     Uterus: Not deviated, not enlarged, not fixed and no uterine prolapse.      Adnexa:        Right: No mass, tenderness or fullness.         Left: No mass, tenderness or fullness.       Rectum: No mass.     Lymphadenopathy:     Upper Body:     Right upper body: No supraclavicular or axillary adenopathy.     Left upper body: No supraclavicular or axillary adenopathy.        Assessment:     63 year old Hispanic female returns to [facility] for annual screening mammogram and pap smear.  [name], the interpreter present during the interview and exam.  Clinical breast exam unremarkable.  Taught self breast awareness.  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  Patient needs 6 month follow up pap per Dr. Marcelline Mates.  Specimen collected for pap without difficulty.   Patient has been screened for eligibility.  She does not have any insurance, Medicare or Medicaid.  She also meets financial eligibility.  Hand-out given on the Affordable Care Act. Patient completed the [place] breast risk assessment with a score of 5.2% life time risk of breast cancer.       Plan:     Screening mammogram ordered.  Discussed possible need to follow up with Dr. Marcelline Mates.  Will wait until I have her pap results and then will schedule follow up as indicated.  Patient is agreeable to the plan.

## 2019-06-09 LAB — PAP LB AND HPV HIGH-RISK: HPV, high-risk: POSITIVE — AB

## 2019-06-17 ENCOUNTER — Encounter: Payer: Self-pay | Admitting: *Deleted

## 2019-06-17 NOTE — Progress Notes (Signed)
Called patient today via Yvette Taylor, the interpreter. Reviewed normal pap results with positive HPV.  Explained need to follow up with Dr. Marcelline Taylor for another colposcopy.  I have scheduled her to see Dr. Marcelline Taylor on 07/14/19 @ 10:30.

## 2019-07-14 ENCOUNTER — Encounter: Payer: Self-pay | Admitting: Obstetrics and Gynecology

## 2019-07-14 ENCOUNTER — Ambulatory Visit (INDEPENDENT_AMBULATORY_CARE_PROVIDER_SITE_OTHER): Payer: Self-pay | Admitting: Obstetrics and Gynecology

## 2019-07-14 ENCOUNTER — Other Ambulatory Visit: Payer: Self-pay

## 2019-07-14 VITALS — BP 128/73 | HR 77 | Ht 59.0 in | Wt 139.4 lb

## 2019-07-14 DIAGNOSIS — B977 Papillomavirus as the cause of diseases classified elsewhere: Secondary | ICD-10-CM

## 2019-07-14 NOTE — Progress Notes (Signed)
      GYNECOLOGY CLINIC COLPOSCOPY PROCEDURE NOTE  63 y.o. [id number] here for colposcopy for NILM, but HPV positive (no genotyping done) pap smear x 2 years. Last pap smear 06/01/2019. 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   Patient given informed consent, signed copy in the chart, time out was performed.  Placed in lithotomy position. Cervix viewed with speculum and colposcope after application of acetic acid.   Colposcopy adequate? Yes  no mosaicism, no punctation, no abnormal vasculature and acetowhite lesion(s) noted at 2 o'clock; corresponding biopsies obtained.  ECC specimen obtained.  Moderate vaginal atrophy present All specimens were labeled and sent to pathology.  Patient was given post procedure instructions.  Will follow up pathology and manage accordingly; patient will be contacted with results and recommendations.  Routine preventative health maintenance measures emphasized.   Spanish Interpreter present for today's exam.   Rubie Maid, MD  [facility]

## 2019-07-14 NOTE — Patient Instructions (Addendum)
Colposcopa, cuidados posteriores Colposcopy, Care After Lea esta informacin sobre cmo cuidarse despus del procedimiento. Su mdico tambin podr darle indicaciones ms especficas. Comunquese con su mdico si tiene problemas o preguntas. Qu puedo esperar despus del procedimiento? Si se le realiz una colposcopa sin biopsia, puede esperar sentirse bien de inmediato, pero es posible que presente manchas de sangre por [address]. Puede reanudar sus actividades habituales. Si se le realiz una colposcopa con biopsia, es frecuente que presente lo siguiente:  Sensibilidad y Engineer, miningdolor. Esto puede durar Time Warneralgunos das.  Sensacin de desvanecimiento.  Sangrado leve de la vagina o secrecin granulada de color oscuro. Esto puede durar Time Warneralgunos das. La secrecin puede deberse a una solucin que se us Cardinal Healthdurante el procedimiento. Durante este tiempo deber usar un apsito sanitario.  Manchas durante al menos 48horas despus del procedimiento. Siga estas indicaciones en su casa:   CenterPoint Energyome los medicamentos de venta libre y los recetados solamente como se lo haya indicado el mdico. Hable con el medicamento acerca de qu tipo de analgsico de venta libre y recetado puede volver a Careers advisertomar. Es especialmente importante que hable con el mdico si toma medicamentos anticoagulantes.  No conduzca ni use maquinaria pesada mientras toma analgsicos recetados.  Durante al menos 3 das despus del procedimiento o durante el tiempo que le haya indicado el mdico, evite lo siguiente: ? Las PepsiCoduchas vaginales. ? Los tampones. ? Tener The St. Paul Travelersrelaciones sexuales.  Contine usando un mtodo anticonceptivo (anticoncepcin).  Limite la actividad fsica durante Film/video editorel primer da despus del procedimiento como se lo haya indicado el mdico. Pregntele al mdico qu actividades son seguras para usted.  Es su responsabilidad retirar Starbucks Corporationlos resultados del procedimiento. Consulte a su mdico o en el departamento donde se realice el  procedimiento cundo estarn Hexion Specialty Chemicalslistos los resultados.  Concurra a todas las visitas de control como se lo haya indicado el mdico. Esto es importante. Comunquese con un mdico si:  Tiene una erupcin cutnea. Solicite ayuda de inmediato si:  Tiene una hemorragia vaginal abundante o elimina cogulos de [place]. Esto incluye usar ms de un apsito sanitario por hora durante 2 horas seguidas.  Tiene fiebre o siente escalofros.  Siente dolor plvico.  Tiene secrecin vaginal anormal, color amarillenta o con mal olor. Puede ser un signo de infeccin.  Tiene dolor intenso o clicos en la parte baja del abdomen que no se alivian con medicamentos.  Tiene vahdos, se siente mareada o se desmaya. Resumen  Si se le realiz una colposcopa sin biopsia, puede esperar sentirse bien de inmediato, pero es posible que presente manchas de sangre por [address] das. Puede reanudar sus actividades habituales.  Si se le realiz una colposcopa con biopsia, puede notar un dolor leve y Hopewellmanchas de Vassar Collegesangre durante 48 horas despus del procedimiento.  Evite usar Chiropodistduchas vaginales, usar tampones y Art gallery managermantener relaciones sexuales durante 3 das luego del procedimiento o durante el tiempo que le haya indicado el mdico.  Comunquese con el mdico si tiene hemorragia, dolor intenso o signos de infeccin. Esta informacin no tiene Theme park managercomo fin reemplazar el consejo del mdico. Asegrese de hacerle al mdico cualquier pregunta que tenga. Document Released: 06/23/2013 Document Revised: 07/23/2017 Document Reviewed: 04/01/2013 Elsevier Patient Education  2020 [employer]vier [employer].    Virus del papiloma humano Human Papillomavirus El virus del papiloma humano (VPH) es la infeccin de transmisin sexual (ITS) ms frecuente. Se transmite fcilmente de persona a persona (es muy contagioso). Hay varios tipos de VPH. A menudo, el VPH no causa sntomas. A veces puede causar  verrugas genitales que pueden verse y sentirse. Es posible que  haya lesiones similares a las Microbiologist. Puede tener el VPH durante mucho tiempo y no saberlo. Puede transmitir el VPH a otras personas sin saberlo. Ciertos tipos de Clinical research associate. Cules son las causas? El VPH es causado por un virus que se propaga de Neomia Dear persona a otra a travs del contacto sexual. Ladell Heads incrementa el riesgo? Puede tener ms probabilidades de contraer el VPH si tiene o ha tenido lo siguiente:  Relaciones sexuales sin proteccin de Publishing copy.  Varias parejas sexuales.  Neomia Dear pareja sexual que tiene Teaching laboratory technician.  Otras ITS.  Un sistema que combate las enfermedades (sistema inmunitario) debilitado.  Piel daada en la zona genital, oral o anal. Cules son los signos o los sntomas? La Harley[employer]son de las personas que tienen el VPH no presentan ningn sntoma. Si tiene sntomas, estos pueden incluir los siguientes:  Lesiones similares a Chief Financial Officer garganta por practicar sexo oral.  Verrugas en las zonas infectadas.  Verrugas genitales que Engineer, manufacturing, arder, Geophysicist/field seismologist o doler durante las The [employer] Travelers. Cmo se trata? No existe un tratamiento para el propio virus. Sin embargo, hay tratamientos para los problemas de salud y los sntomas que puede Engineer, structural. El mdico puede tratar el VPH mediante lo siguiente:  Administracin de medicamentos que sean cremas, lociones, lquidos o geles. Estos medicamentos pueden inyectarse o aplicarse en las verrugas genitales o anales.  Aplicacin en las verrugas genitales o anales de: ? Fro extremo. ? Un haz de luz intenso (tratamiento con lser). ? Calor extremo.  Ciruga para extirpar las verrugas genitales o anales. El mdico lo controlar detenidamente despus del tratamiento. El VPH puede reaparecer y es posible que requiera tratamiento nuevamente. Siga estas instrucciones en su casa: Medicamentos  Tome los medicamentos de venta libre y los recetados solamente como se lo  haya indicado el mdico.  No trate las verrugas genitales con los medicamentos utilizados para las verrugas de las manos. Instrucciones generales  No toque ni rasque las verrugas.  No mantenga relaciones sexuales durante el tratamiento.  No utilice tampones ni duchas vaginales durante el tratamiento (en el caso de las mujeres).  Hable con sus pareja sexual sobre su infeccin. Es posible que su pareja tambin necesite Zeb.  Si queda embarazada, informe a su mdico que [employer]. El mdico la controlar durante el [place].  Concurra a todas las visitas de [address] se lo haya indicado el mdico. Esto es importante. Cmo se evita?  Hable con el mdico sobre recibir Geophysical data processor VPH. Esto puede evitar algunas infecciones por el VPH y cnceres relacionados. Es posible que necesite 2 o 3 dosis de la vacuna, segn su edad. ? La vacuna no surtir efecto si ya [employer]. ? La vacuna no se recomienda en mujeres embarazadas.  Despus del tratamiento, use preservativos cuando Control and instrumentation engineer. Esto ayuda en la prevencin de futuras infecciones.  Tenga solo una pareja sexual.  No tenga Neomia Dear pareja sexual que tenga otras parejas sexuales.  Asegrese de realizarse la prueba de Papanicolaou como se lo haya indicado el mdico. Comunquese con un mdico si:  La piel tratada se enrojece, se hincha o duele.  Tiene fiebre.  Se siente enfermo.  Palpa bultos o granos en la zona genital o anal, o en su alrededor.  Tiene una hemorragia vaginal.  Tiene hemorragia en la zona que recibi tratamiento.  Siente dolor [employer]  relaciones sexuales. Resumen  El VPH es la ITS ms frecuente. El virus se transmite fcilmente de Ardelia Mems persona a [place].  A menudo, el VPH no causa ningn sntoma.  La vacuna contra el VPH previene algunas infecciones por el VPH y cnceres relacionados.  No existe un tratamiento para el propio virus. Sin embargo, hay tratamientos para  los problemas de salud y los sntomas que puede Ecologist. Esta informacin no tiene Marine scientist el consejo del mdico. Asegrese de hacerle al mdico cualquier pregunta que tenga. Document Released: 10/05/2010 Document Revised: 06/05/2018 Document Reviewed: 06/05/2018 Elsevier Patient Education  [place].   Vaginitis atrfica Atrophic Vaginitis  La vaginitis atrfica es una afeccin en la que los tejidos que recubren la vagina se secan y [place]. Esta afeccin es ms frecuente en las mujeres que ya no tienen perodos menstruales regulares (que se encuentran en la menopausia). Esto suele comenzar cuando la mujer tiene entre 62 y 71aos. Es el perodo en el cual los niveles de estrgeno de una mujer comienzan a Sports coach (disminuir). El estrgeno es una hormona femenina. Ayuda a Consulting civil engineer humedad de los tejidos de la vagina. La estimula para producir un lquido transparente que lubrica la vagina durante las relaciones sexuales. Este lquido tambin protege a la vagina contra las infecciones. La falta de estrgeno puede hacer que el recubrimiento de la vagina se afine y se seque. El tamao de la vagina tambin puede disminuir y perder elasticidad. La vaginitis atrfica tiende a Engineering geologist a medida que el nivel de estrgeno desciende. Cules son las causas? La causa de esta afeccin es el descenso normal del nivel de estrgeno que se produce en torno a Mudlogger. Qu incrementa el riesgo? Determinadas afecciones o situaciones pueden reducir [employer] de estrgeno de Stella, y esto aumenta el riesgo de presentar vaginitis atrfica. Es ms probable que usted sufra esta afeccin si:  Toma medicamentos que detienen la produccin de Oceanographer.  Le extirparon los ovarios.  Recibe tratamiento contra el cncer mediante rayos X (radiacin) o medicamentos (quimioterapia).  Ha dado a luz o est amamantando.  Es mayor de 50aos.  Fuma. Cules son los signos o  sntomas? Los sntomas de esta afeccin [employer] siguientes:  Social research officer, government, Scientist, research (life sciences) o sangrado durante las relaciones sexuales (dispareunia).  Ardor, irritacin o picazn vaginal.  Dolor o sangrado durante un examen vaginal con espculo (examen plvico).  Ardor al [place].  Secrecin vaginal marrn o amarilla. En algunos casos no hay sntomas. Cmo se diagnostica? Esta afeccin se diagnostica en funcin de los antecedentes mdicos y mediante la realizacin de un examen fsico. Esto incluir un examen plvico que controla los tejidos vaginales. Aunque es poco frecuente, tambin pueden hacerle otras pruebas, [place] las siguientes:  Un anlisis de [place].  Una prueba que verifica el equilibrio de cido en la vagina (prueba de equilibrio de cido). Cmo se trata? El tratamiento de esta afeccin depende de la gravedad de los sntomas. El tratamiento puede incluir lo siguiente:  Uso de un lubricante vaginal de venta libre antes de las relaciones sexuales.  Uso de una crema humectante vaginal de accin prolongada.  Uso de estrgeno vaginal de dosis bajas para los sntomas de moderados a graves que no responden a otros tratamientos. Las opciones incluyen cremas, tabletas y [place]. Antes de usar estrgeno vaginal, dgale al mdico si tiene antecedentes de: ? Cncer de mama. ? Cncer de endometrio. ? Cogulos de [place]. Si usted no Engineer, maintenance  y sus sntomas son muy leves, es posible que no necesite tratamiento. Siga estas indicaciones en su casa: Medicamentos  [employer] de venta libre y los recetados solamente como se lo haya indicado el mdico. No utilice medicamentos alternativos o a base de hierbas a menos que el mdico se lo autorice.  Utilice cremas, lubricantes o cremas humectantes de venta libre para la sequedad vaginal solamente como se lo haya indicado el mdico. Instrucciones generales  Si la causa de la vaginitis atrfica es la menopausia,  analice todos sus sntomas de menopausia y opciones de tratamiento con el mdico.  No se haga duchas vaginales.  No use productos que puedan causarle sequedad vaginal. Estos incluyen los siguientes: ? Aerosoles femeninos perfumados. ? Tampones perfumados. ? Jabones perfumados.  Las relaciones sexuales vaginales pueden ayudar a Scientist, clinical (histocompatibility and immunogenetics) el flujo sanguneo y la elasticidad del tejido vaginal. Si siente dolor durante el coito, intente usar un lubricante o humectante justo antes de Management consultant. Comunquese con un mdico si:  El flujo vaginal tiene un aspecto diferente del normal.  Detecta que tiene un olor inusual en la vagina.  Aparecen nuevos sntomas.  Los sntomas no mejoran con Scientist, research (medical).  Los sntomas empeoran. Resumen  La vaginitis atrfica es una afeccin en la que los tejidos que recubren la vagina se secan y [place]. Es ms frecuente en las mujeres que ya no tienen perodos menstruales regulares (que se encuentran en la menopausia).  Las opciones de tratamiento pueden incluir usar lubricantes vaginales y estrgeno vaginal en dosis bajas.  Comunquese con un mdico si siente un olor inusual en la vagina o los sntomas empeoran o no mejoran despus del tratamiento. Esta informacin no tiene Theme park manager el consejo del mdico. Asegrese de hacerle al mdico cualquier pregunta que tenga. Document Released: 01/17/2015 Document Revised: 08/04/2017 Document Reviewed: 08/04/2017 Elsevier Patient Education  2020 [employer].

## 2019-07-14 NOTE — Progress Notes (Signed)
Pt present for colpo. Pt stated that she was doing well other than having back pain no gyn issues no vaginal bleeding.

## 2019-07-15 ENCOUNTER — Other Ambulatory Visit: Payer: Self-pay | Admitting: Obstetrics and Gynecology

## 2019-07-22 LAB — ANATOMIC PATHOLOGY REPORT: PDF Image: 0

## 2019-07-23 NOTE — Progress Notes (Signed)
Patient had normal biopsy results. Will need a repeat pap smear in 1 year.   Dr. Marcelline Mates

## 2019-07-30 ENCOUNTER — Encounter: Payer: Self-pay | Admitting: *Deleted

## 2019-07-30 NOTE — Progress Notes (Signed)
Letter mailed to inform patient of the need to return in one year for annual screening and 1 year f/u pap per Dr. Marcelline Mates.  Appointment scheduled for 06/06/20 @ 9:00

## 2020-06-06 ENCOUNTER — Ambulatory Visit: Payer: Self-pay | Attending: Oncology

## 2020-06-06 ENCOUNTER — Ambulatory Visit
Admission: RE | Admit: 2020-06-06 | Discharge: 2020-06-06 | Disposition: A | Discharge status: Home / Self Care | Payer: Self-pay | Source: Ambulatory Visit | Attending: Oncology | Admitting: Oncology

## 2020-06-06 ENCOUNTER — Other Ambulatory Visit: Payer: Self-pay

## 2020-06-06 ENCOUNTER — Encounter (INDEPENDENT_AMBULATORY_CARE_PROVIDER_SITE_OTHER): Payer: Self-pay

## 2020-06-06 VITALS — BP 128/63 | HR 77 | Temp 98.0°F | Resp 20 | Wt 138.0 lb

## 2020-06-06 DIAGNOSIS — Z0000 Encounter for general adult medical examination without abnormal findings: Secondary | ICD-10-CM | POA: Insufficient documentation

## 2020-06-06 NOTE — Progress Notes (Signed)
  Subjective:     Patient ID: Yvette Taylor, female   DOB: 11/25/1955, 64 y.o.   MRN: [id number]  HPI   Review of Systems     Objective:   Physical Exam Chest:     Breasts:        Right: No swelling, bleeding, inverted nipple, mass, nipple discharge, skin change or tenderness.        Left: No swelling, bleeding, inverted nipple, mass, nipple discharge, skin change or tenderness.  Genitourinary:    Labia:        Right: No rash, tenderness, lesion or injury.        Left: No rash, tenderness, lesion or injury.      Vagina: No signs of injury and foreign body. No vaginal discharge, erythema, tenderness, bleeding, lesions or prolapsed vaginal walls.     Cervix: No cervical motion tenderness, discharge, friability, lesion, erythema, cervical bleeding or eversion.     Uterus: Not deviated, not enlarged, not fixed, not tender and no uterine prolapse.      Adnexa:        Right: No mass, tenderness or fullness.         Left: No mass, tenderness or fullness.          Assessment:     64 year old Hispanic patient patient presents for [facility] clinic visit.  Patient screened, and meets [facility]CP eligibility.  Patient does not have insurance, Medicare or Medicaid. Instructed patient on breast self awareness using teach back method.  Clinical breast exam unremarkable.  No mass or lump palpated.  Pelvic exam normal.   Risk Assessment    Risk Scores      06/06/2020 06/01/2019   Last edited by: Neita Garnet, CMA Scarlett Presto, RN   5-year risk: 1.2 % 1.2 %   Lifetime risk: 6 % 6.2 %            Plan:     Sent for bilateral screening mammogram.  Specimen collected for pap.

## 2020-07-06 ENCOUNTER — Encounter: Payer: Self-pay | Admitting: Oncology

## 2020-07-07 NOTE — Progress Notes (Signed)
Letter mailed to patient to notify of normal mammogram, and negative/positive HPV pap smear results. Patient had a benign cervical biopsy 07/15/19 with Dr, Valentino Saxon.   Per AS[facility]P guidelines repeat pap in one year.  Messaged Dr. Valentino Saxon to confirm.  Copy to [facility].

## 2021-06-06 ENCOUNTER — Other Ambulatory Visit: Payer: Self-pay

## 2021-06-06 ENCOUNTER — Ambulatory Visit: Payer: Self-pay | Attending: Oncology

## 2021-06-06 ENCOUNTER — Ambulatory Visit
Admission: RE | Admit: 2021-06-06 | Discharge: 2021-06-06 | Disposition: A | Discharge status: Home / Self Care | Payer: Self-pay | Source: Ambulatory Visit | Attending: Oncology | Admitting: Oncology

## 2021-06-06 VITALS — BP 129/68 | HR 78 | Temp 97.8°F | Resp 16 | Wt 141.0 lb

## 2021-06-06 DIAGNOSIS — Z0000 Encounter for general adult medical examination without abnormal findings: Secondary | ICD-10-CM

## 2021-06-06 DIAGNOSIS — R87810 Cervical high risk human papillomavirus (HPV) DNA test positive: Secondary | ICD-10-CM

## 2021-06-06 NOTE — Progress Notes (Signed)
  Subjective:     Patient ID: Yvette Taylor, female   DOB: 08/12/1956, 65 y.o.   MRN: [id number]  HPI   Review of Systems     Objective:   Physical Exam Chest:  Breasts:    Right: No swelling, bleeding, inverted nipple, mass, nipple discharge, skin change or tenderness.     Left: No swelling, bleeding, inverted nipple, mass, nipple discharge, skin change or tenderness.  Genitourinary:    Labia:        Right: No rash, tenderness, lesion or injury.        Left: No rash, tenderness, lesion or injury.      Vagina: No signs of injury and foreign body. No vaginal discharge, erythema, tenderness, bleeding, lesions or prolapsed vaginal walls.     Cervix: No cervical motion tenderness, discharge, friability, lesion, erythema, cervical bleeding or eversion.     Uterus: Not deviated, not enlarged, not fixed, not tender and no uterine prolapse.      Adnexa:        Right: No mass, tenderness or fullness.         Left: No mass, tenderness or fullness.         Assessment:     65 year old Hispanic patient returns for BCCCP screening and follow-up pap due to negative /HPV positive pap and benign cervical biopsy 2021. Patient screened, and meets B[facility]CP eligibility.  Patient does not have insurance, Medicare or Medicaid.  Instructed patient on breast self awareness using teach back method Clinical breast exam unremarkable. Pelvic exam normal.  Risk Assessment   No risk assessment data for the current encounter  Risk Scores       06/06/2020   Last edited by: Neita Garnet, CMA   5-year risk: 1.2 %   Lifetime risk: 6 %               Plan:     Sent for bilateral screening mammogram. Specimen collected for pap.

## 2021-06-12 ENCOUNTER — Encounter: Payer: Self-pay | Admitting: Oncology

## 2021-06-12 NOTE — Progress Notes (Signed)
Pap results negative/HPV positive. Request for pap follow-up recommendation sent to Dr. Valentino Saxon.

## 2021-06-13 NOTE — Progress Notes (Signed)
Letter mailed to patient to notify of normal mammogram, and pap smear results. Patient  Instructed to return for annual screening. Per Dr. Valentino Saxon, next pap in 3 years. Copy to [facility].

## 2022-12-28 IMAGING — MG MM DIGITAL SCREENING BILAT W/ TOMO AND CAD
8 series · 8 of 24 positions shown · non-contrast
Comparison: Previous exam(s).

CLINICAL DATA: Screening.

EXAM:
DIGITAL SCREENING BILATERAL MAMMOGRAM WITH TOMOSYNTHESIS AND CAD
TECHNIQUE: Bilateral screening digital craniocaudal and mediolateral oblique
mammograms were obtained. Bilateral screening digital breast
tomosynthesis was performed. The images were evaluated with
computer-aided detection.

[L CC synth-2D]
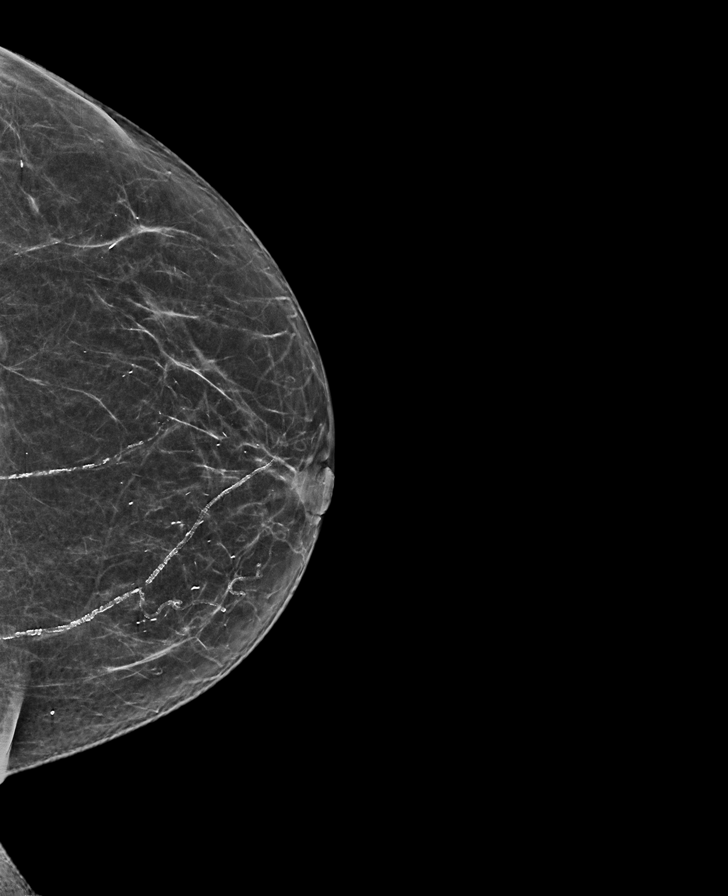

[L MLO synth-2D]
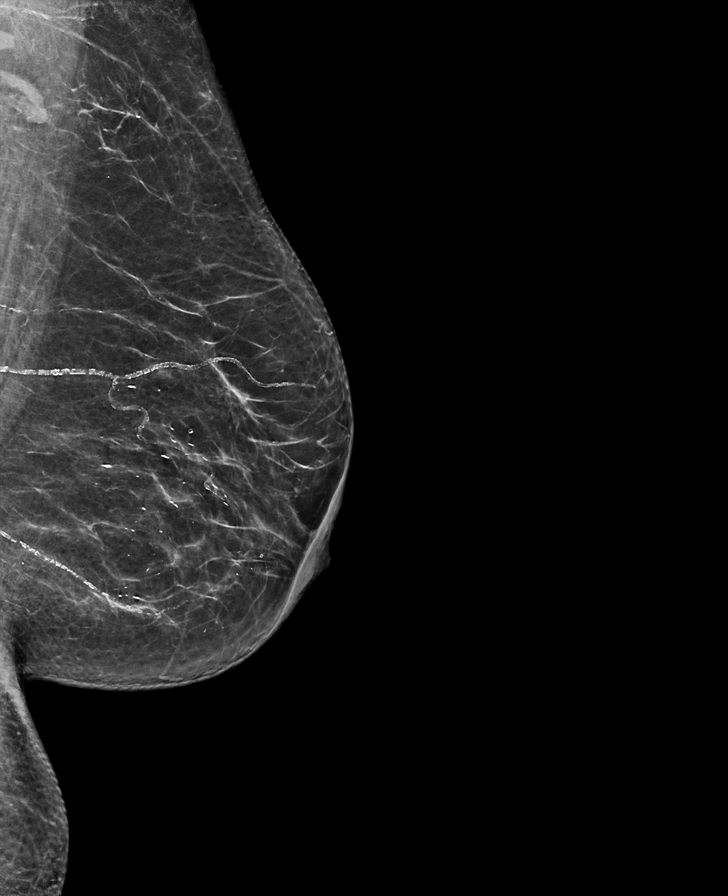

[R MLO synth-2D]
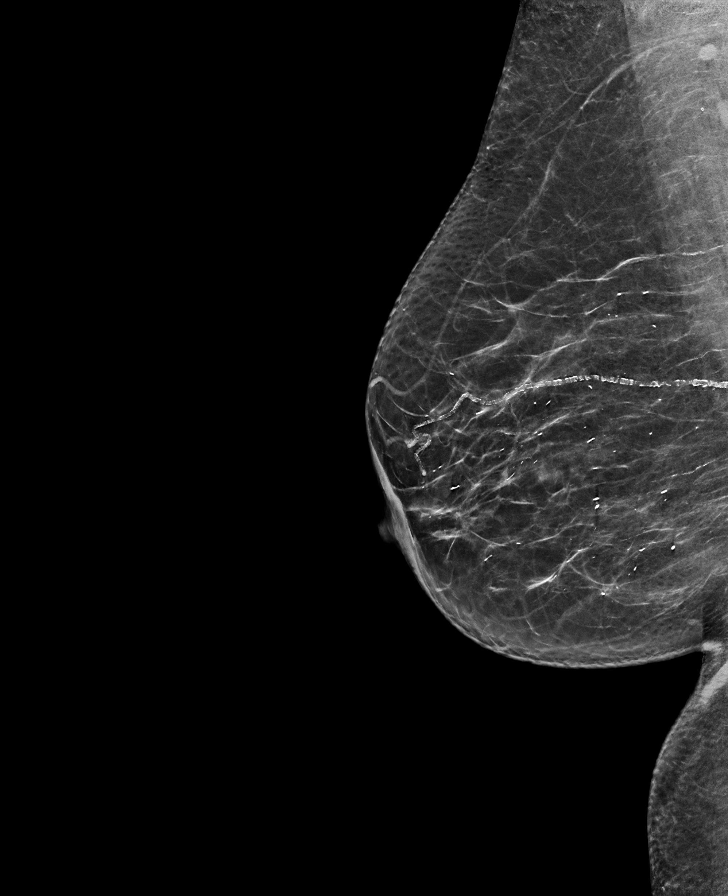

[R CC synth-2D]
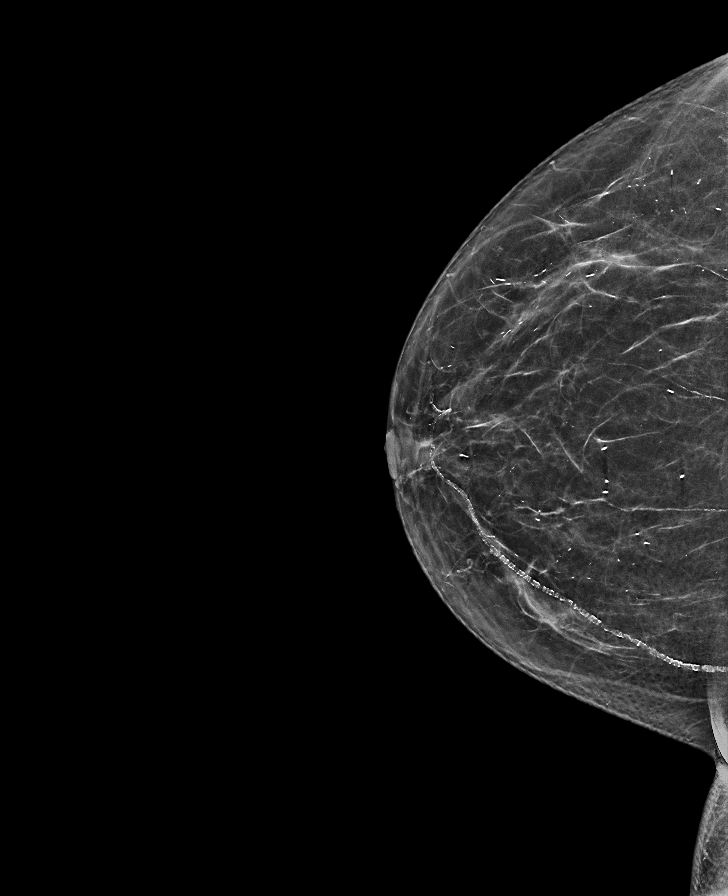

[R MLO tomo · tomo slice 35/70.0]
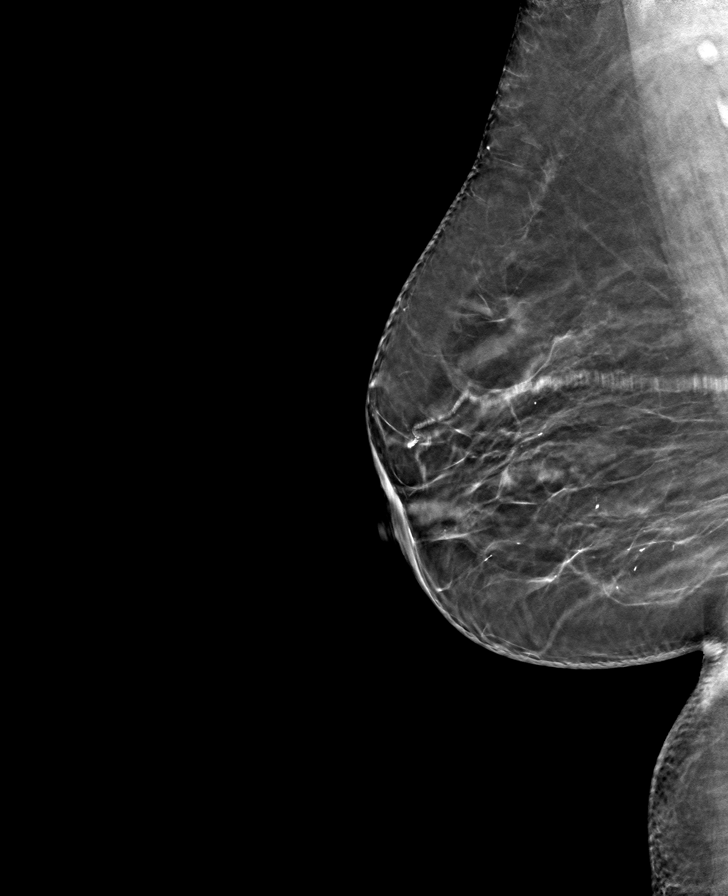

[L CC tomo · tomo slice 33/65.0]
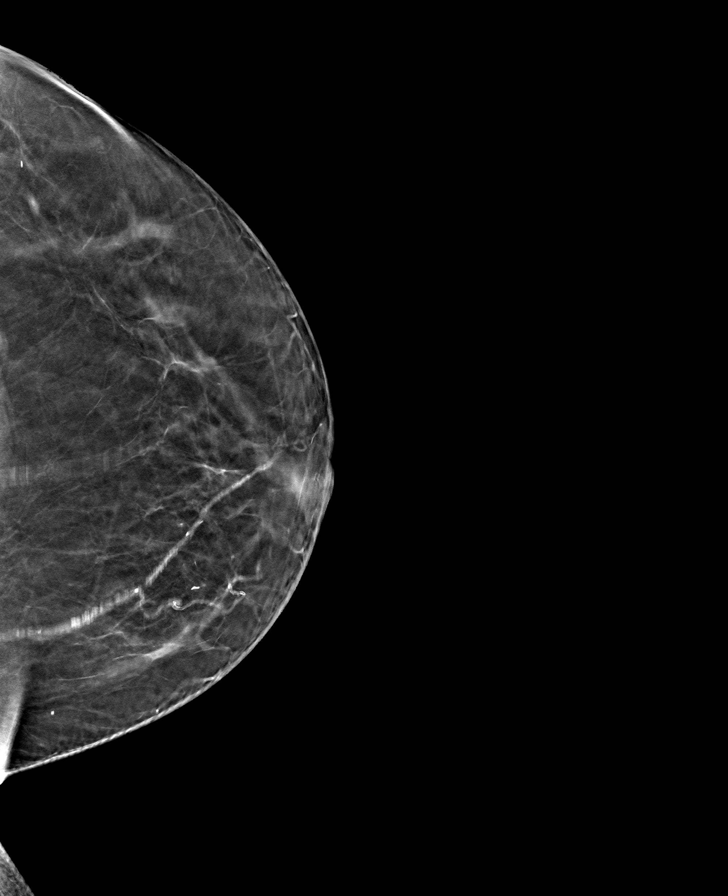

[L MLO tomo · tomo slice 35/70.0]
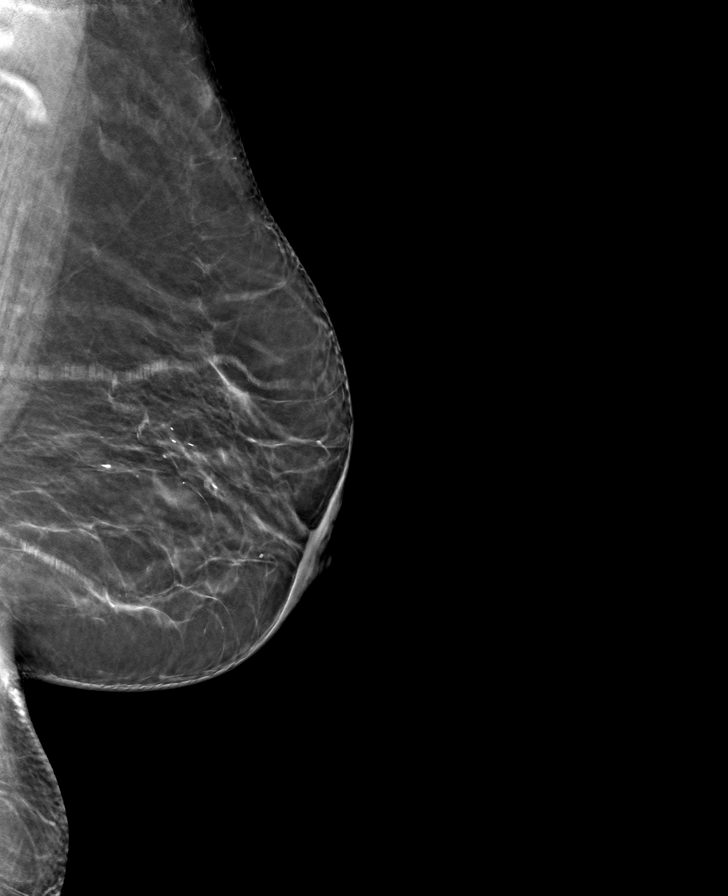

[R CC tomo · tomo slice 32/63.0]
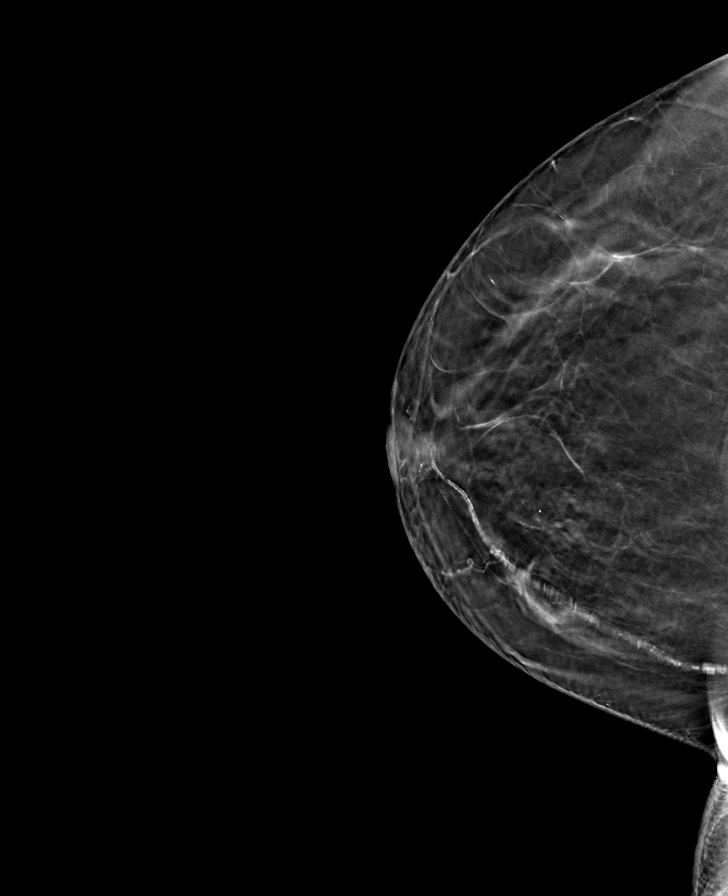

[8 of 24 positions shown; findings below may reference images not displayed]

ACR Breast Density Category b: There are scattered areas of
fibroglandular density.
FINDINGS: There are no findings suspicious for malignancy.
IMPRESSION: No mammographic evidence of malignancy. A result letter of this
screening mammogram will be mailed directly to the patient.

RECOMMENDATION:
Screening mammogram in one year. (Code:51-O-LD2)

BI-RADS CATEGORY  1: Negative.
